# Patient Record
Sex: Female | Born: 1990 | Race: Black or African American | Hispanic: No | Marital: Single | Smoking: Never smoker
Health system: Southern US, Community
[De-identification: ages and names within clinical notes are randomized; demographics above are authoritative.]

---

## 2015-12-25 ENCOUNTER — Emergency Department (HOSPITAL_COMMUNITY): Payer: Medicaid Other

## 2015-12-25 ENCOUNTER — Encounter (HOSPITAL_COMMUNITY): Payer: Self-pay | Admitting: *Deleted

## 2015-12-25 ENCOUNTER — Emergency Department (HOSPITAL_COMMUNITY)
Admission: EM | Admit: 2015-12-25 | Discharge: 2015-12-25 | Disposition: A | Discharge status: Home / Self Care | Payer: Medicaid Other | Attending: Emergency Medicine | Admitting: Emergency Medicine

## 2015-12-25 DIAGNOSIS — X509XXA Other and unspecified overexertion or strenuous movements or postures, initial encounter: Secondary | ICD-10-CM | POA: Diagnosis not present

## 2015-12-25 DIAGNOSIS — Y929 Unspecified place or not applicable: Secondary | ICD-10-CM | POA: Insufficient documentation

## 2015-12-25 DIAGNOSIS — Y999 Unspecified external cause status: Secondary | ICD-10-CM | POA: Insufficient documentation

## 2015-12-25 DIAGNOSIS — Y9301 Activity, walking, marching and hiking: Secondary | ICD-10-CM | POA: Diagnosis not present

## 2015-12-25 DIAGNOSIS — S93401A Sprain of unspecified ligament of right ankle, initial encounter: Secondary | ICD-10-CM | POA: Diagnosis not present

## 2015-12-25 DIAGNOSIS — S99911A Unspecified injury of right ankle, initial encounter: Secondary | ICD-10-CM | POA: Diagnosis present

## 2015-12-25 MED ORDER — NAPROXEN 500 MG PO TABS: 500.0000 mg | ORAL_TABLET | Freq: Two times a day (BID) | ORAL | 0 refills | Status: AC

## 2015-12-25 MED ORDER — OXYCODONE-ACETAMINOPHEN 5-325 MG PO TABS
1.0000 | ORAL_TABLET | Freq: Once | ORAL | Status: AC
  Administered 2015-12-25: 1 via ORAL
  Filled 2015-12-25: qty 1

## 2015-12-25 MED ORDER — ONDANSETRON 4 MG PO TBDP
4.0000 mg | ORAL_TABLET | Freq: Once | ORAL | Status: AC
  Administered 2015-12-25: 4 mg via ORAL
  Filled 2015-12-25: qty 1

## 2015-12-25 MED ORDER — HYDROCODONE-ACETAMINOPHEN 5-325 MG PO TABS: 1.0000 | ORAL_TABLET | ORAL | 0 refills | Status: AC | PRN

## 2015-12-25 NOTE — ED Notes (Signed)
Bed: WTR6 Expected date:  Expected time:  Means of arrival:  Comments: 

## 2015-12-25 NOTE — ED Provider Notes (Signed)
[facility]-EMERGENCY DEPT Provider Note   CSN: [id number] Arrival date & time: 12/25/15  1254  By signing my name below, I, Emmanuella Mensah, attest that this documentation has been prepared under the direction and in the presence of Eduin Friedel, PA-C. Electronically Signed: Angelene Giovanni, ED Scribe. 12/25/15. 2:42 PM.   History   Chief Complaint Chief Complaint  Patient presents with  . Ankle Pain    HPI Comments: Barbara Ramirez is a 25 y.o. female who presents to the Emergency Department complaining of gradually worsening 10/10 right ankle pain with swelling to the lateral aspect s/p injury that occurred yesterday. She reports associated difficulty ambulating and ROM secondary to pain. She explains that she was ambulating with tennis shoes when she rolled her ankle inward. No fall or LOC sustained. No alleviating factors noted. Pt has not tried any medications PTA. She has an allergy to Penicillins. She denies any fever, chills, numbness/tingling, or any open wounds.   The history is provided by the patient. No language interpreter was used.    History reviewed. No pertinent past medical history.  There are no active problems to display for this patient.   History reviewed. No pertinent surgical history.  OB History    No data available       Home Medications    Prior to Admission medications   Not on File    Family History No family history on file.  Social History Social History  Substance Use Topics  . Smoking status: Never Smoker  . Smokeless tobacco: Never Used  . Alcohol use Yes     Allergies   Penicillins   Review of Systems Review of Systems  Constitutional: Negative for chills and fever.  Musculoskeletal: Positive for arthralgias and joint swelling.  Skin: Negative for wound.  All other systems reviewed and are negative.    Physical Exam Updated Vital Signs BP 109/63 (BP Location: Left Arm)   Pulse 108   Temp 98 F (36.7 C) (Oral)   Resp  18   LMP 12/18/2015   SpO2 96%   Physical Exam  Constitutional: She is oriented to person, place, and time. She appears well-developed and well-nourished. No distress.  HENT:  Head: Normocephalic and atraumatic.  Eyes: Conjunctivae and EOM are normal.  Neck: Neck supple. No tracheal deviation present.  Cardiovascular: Normal rate.   Pulmonary/Chest: Effort normal. No respiratory distress.  Musculoskeletal:  Right lateral ankle edematous and tender. Limited ROM of ankle. No foot or toe tenderness or deformity. 2+ DP. Brisk cap refill x 5.   Neurological: She is alert and oriented to person, place, and time.  Skin: Skin is warm and dry.  Psychiatric: She has a normal mood and affect. Her behavior is normal.  Nursing note and vitals reviewed.    ED Treatments / Results  DIAGNOSTIC STUDIES: Oxygen Saturation is 96% on RA, adequate by my interpretation.    COORDINATION OF CARE: 2:41 PM- Pt advised of plan for treatment and pt agrees. Pt informed of her x-ray results. She will receive percocet. Will provide resources for Ortho follow up.    Labs (all labs ordered are listed, but only abnormal results are displayed) Labs Reviewed - No data to display  EKG  EKG Interpretation None       Radiology Dg Ankle Complete Right  Result Date: 12/25/2015 CLINICAL DATA:  Right ankle pain and swelling since injury yesterday. EXAM: RIGHT ANKLE - COMPLETE 3+ VIEW COMPARISON:  None. FINDINGS: There is soft tissue swelling overlying the  lateral malleolus. Underlying osseous structures are intact and normally aligned. No fracture line or displaced fracture fragment seen. Ankle mortise is symmetric. IMPRESSION: Soft tissue swelling.  No osseous fracture or dislocation. Electronically Signed   By: Bary RichardStan  Maynard M.D.   On: 12/25/2015 13:43    Procedures Procedures (including critical care time)  Medications Ordered in ED Medications - No data to display   Initial Impression / Assessment and  Plan / ED Course  Barbara PennerSerena Adhira Jamil, PA-C has reviewed the triage vital signs and the nursing notes.  Pertinent labs & imaging results that were available during my care of the patient were reviewed by me and considered in my medical decision making (see chart for details).  Clinical Course   Xray with soft tissue swelling but no fracture or dislocation. Likely sprain. Will place in brace with crutches and instructed f/u with ortho. Pain meds rx given.   Prior to discharge pt had one episode of emesis and states she has been nauseated all day. 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 States she was intoxicated when she hurt her ankle. zofran ODT given. Has had no further episodes of emesis.   Final Clinical Impressions(s) / ED Diagnoses   Final diagnoses:  Right ankle sprain, initial encounter    New Prescriptions New Prescriptions   HYDROCODONE-ACETAMINOPHEN (NORCO/VICODIN) 5-325 MG TABLET    Take 1-2 tablets by mouth every 4 (four) hours as needed for severe pain.   NAPROXEN (NAPROSYN) 500 MG TABLET    Take 1 tablet (500 mg total) by mouth 2 (two) times daily.   I personally performed the services described in this documentation, which was scribed in my presence. The recorded information has been reviewed and is accurate.    Barbara CoriaSerena Y Kayl Stogdill, PA-C 12/25/15 1548    Azalia BilisKevin Campos, MD 12/26/15 [phone]

## 2015-12-25 NOTE — Discharge Instructions (Signed)
Take medication as prescribed. Ice your ankle on and off for the next 48 hrs. Follow up with Dr. Magnus IvanBlackman of orthopedics. Return to the ER for new or worsening symptoms.

## 2015-12-25 NOTE — ED Triage Notes (Signed)
Pt complains of right ankle pain and swelling since rolling her ankle yesterday while walking.

## 2018-05-27 IMAGING — CR DG ANKLE COMPLETE 3+V*R*
3 series · 3 of 3 positions shown · non-contrast
Comparison: None.

CLINICAL DATA: Right ankle pain and swelling since injury
yesterday.

EXAM:
RIGHT ANKLE - COMPLETE 3+ VIEW

[x ankle ap right]
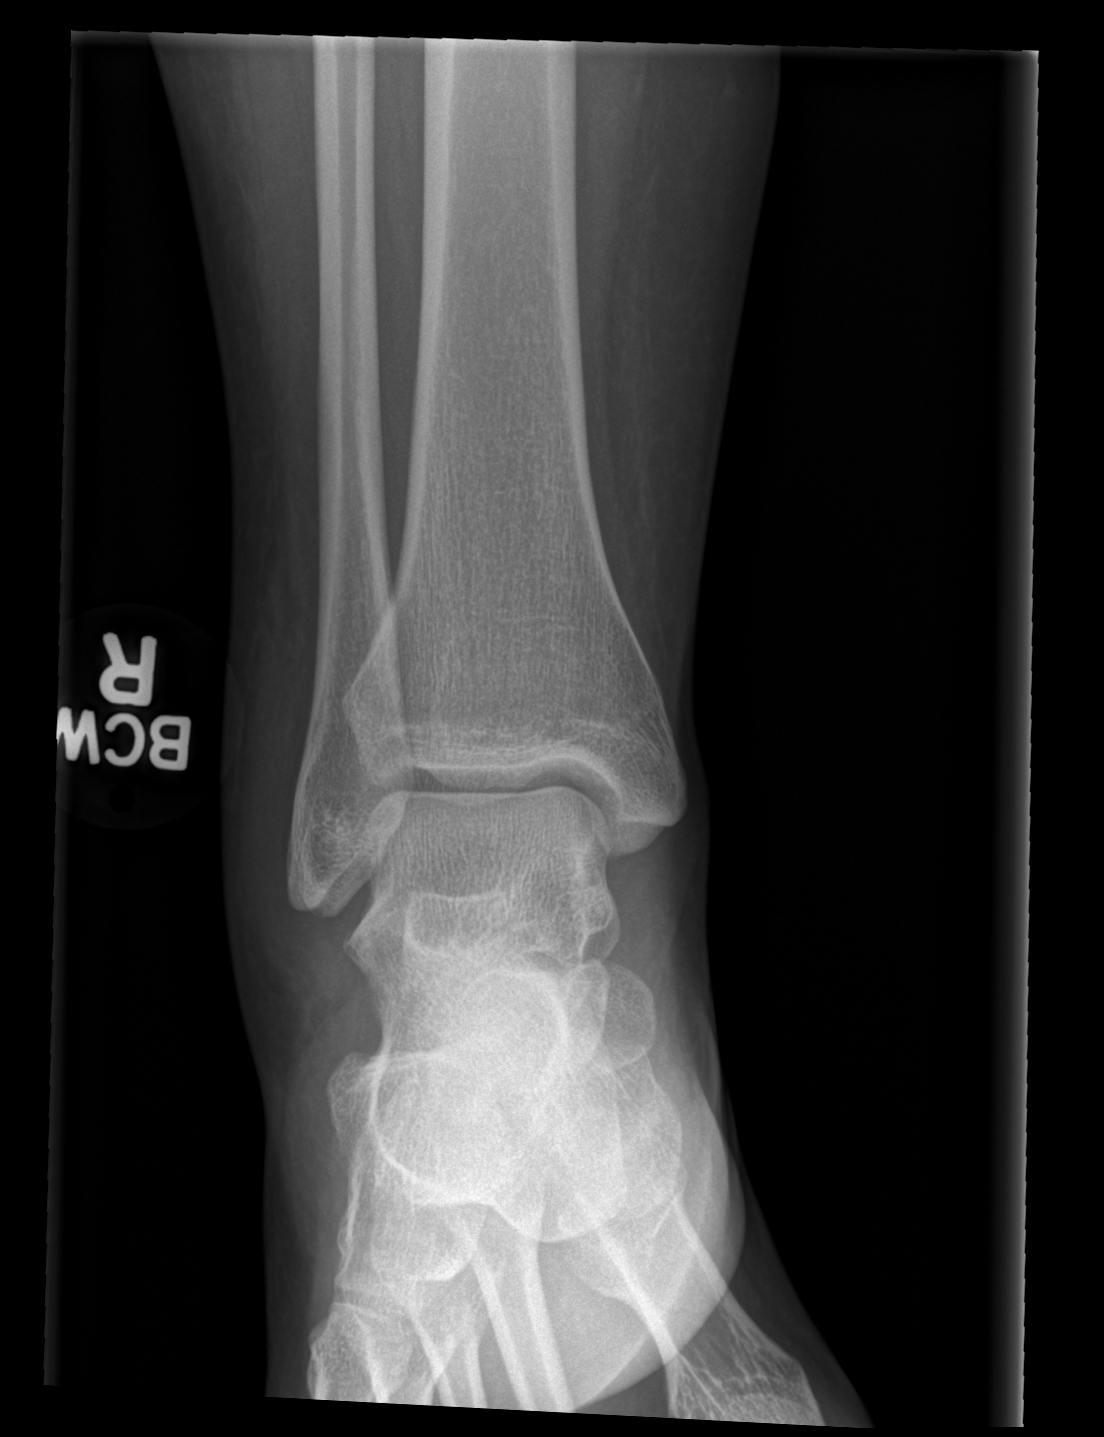

[x ankle obl right]
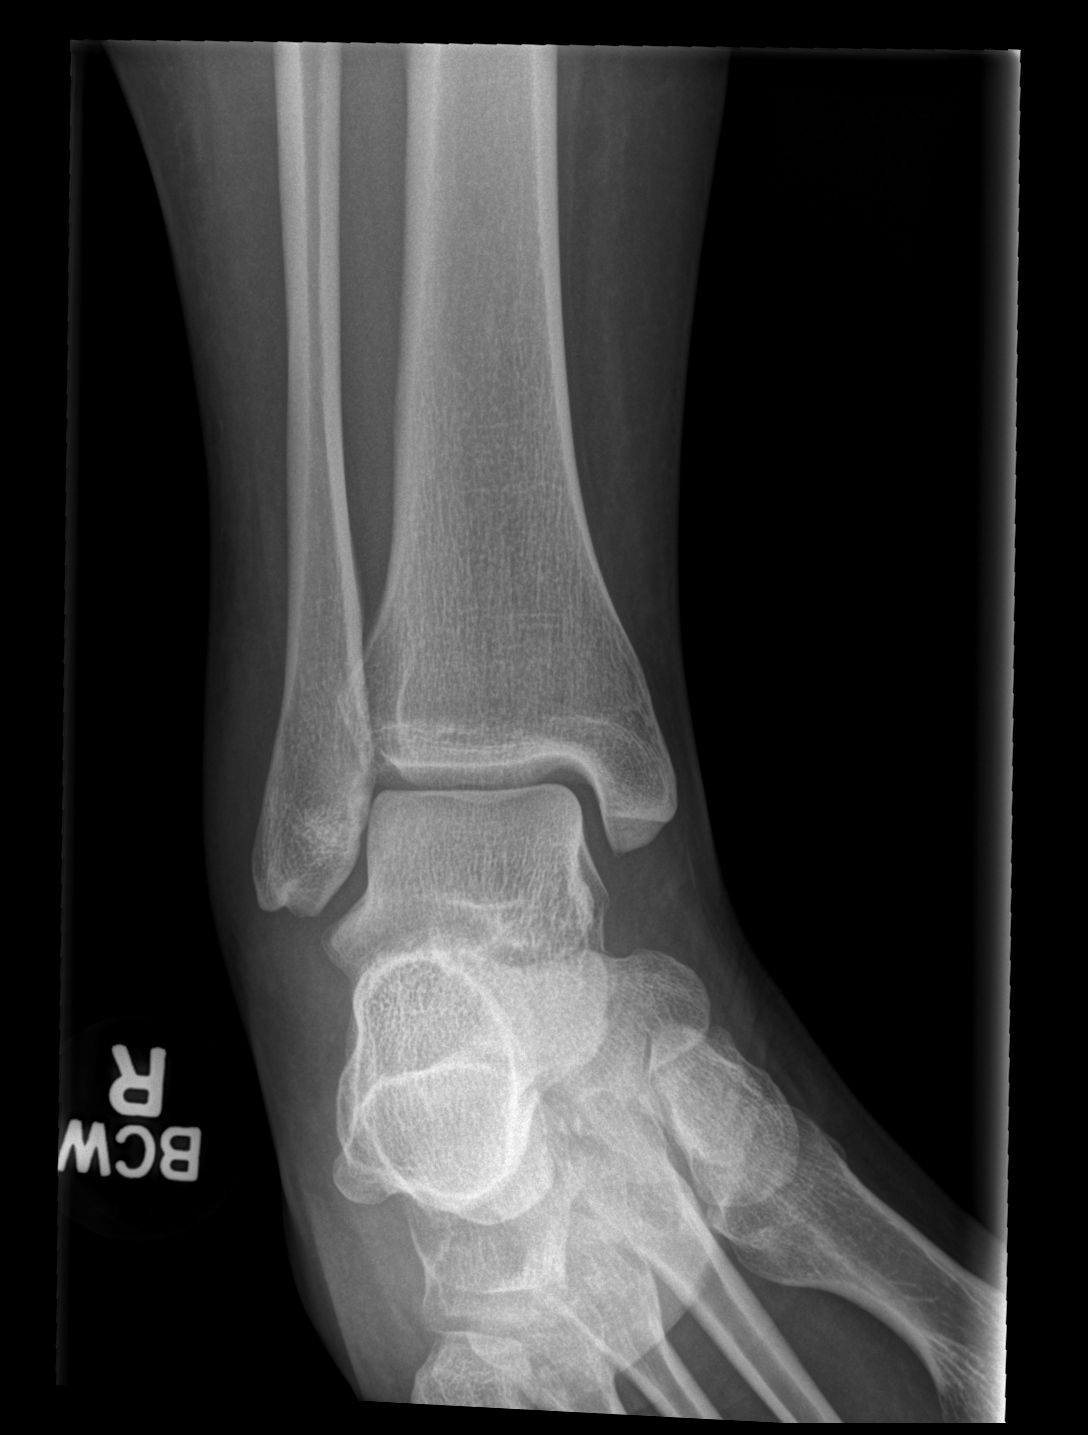

[x ankle lat right]
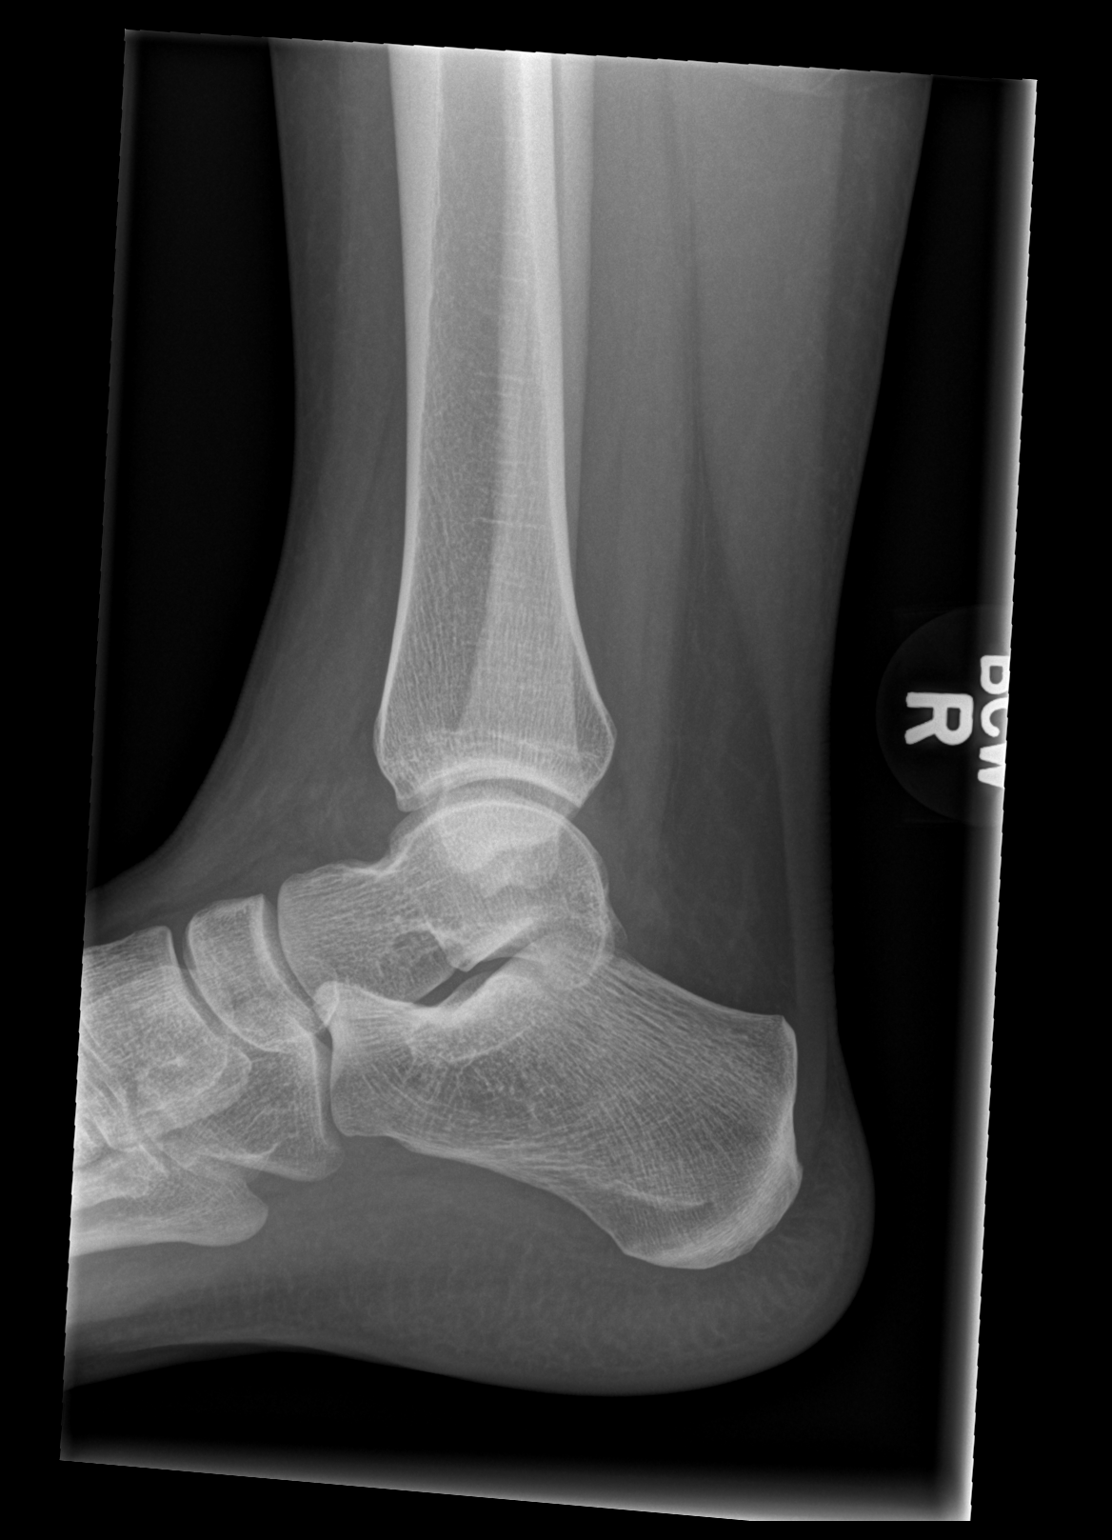

[3 of 3 positions shown; findings below may reference images not displayed]

FINDINGS: There is soft tissue swelling overlying the lateral malleolus.
Underlying osseous structures are intact and normally aligned. No
fracture line or displaced fracture fragment seen. Ankle mortise is
symmetric.
IMPRESSION: Soft tissue swelling.  No osseous fracture or dislocation.

## 2022-09-25 ENCOUNTER — Emergency Department (HOSPITAL_COMMUNITY): Payer: Self-pay

## 2022-09-25 ENCOUNTER — Other Ambulatory Visit: Payer: Self-pay

## 2022-09-25 ENCOUNTER — Emergency Department (HOSPITAL_COMMUNITY)
Admission: EM | Admit: 2022-09-25 | Discharge: 2022-09-25 | Disposition: A | Discharge status: Home / Self Care | Payer: Self-pay | Attending: Emergency Medicine | Admitting: Emergency Medicine

## 2022-09-25 ENCOUNTER — Encounter (HOSPITAL_COMMUNITY): Payer: Self-pay

## 2022-09-25 DIAGNOSIS — S0083XA Contusion of other part of head, initial encounter: Secondary | ICD-10-CM | POA: Insufficient documentation

## 2022-09-25 DIAGNOSIS — Y9259 Other trade areas as the place of occurrence of the external cause: Secondary | ICD-10-CM | POA: Insufficient documentation

## 2022-09-25 DIAGNOSIS — S0011XA Contusion of right eyelid and periocular area, initial encounter: Secondary | ICD-10-CM | POA: Insufficient documentation

## 2022-09-25 DIAGNOSIS — M25511 Pain in right shoulder: Secondary | ICD-10-CM | POA: Insufficient documentation

## 2022-09-25 MED ORDER — OXYCODONE-ACETAMINOPHEN 5-325 MG PO TABS
1.0000 | ORAL_TABLET | Freq: Once | ORAL | Status: AC
  Administered 2022-09-25: 1 via ORAL
  Filled 2022-09-25: qty 1

## 2022-09-25 MED ORDER — OXYCODONE-ACETAMINOPHEN 5-325 MG PO TABS: 1.0000 | ORAL_TABLET | Freq: Four times a day (QID) | ORAL | 0 refills | Status: AC | PRN

## 2022-09-25 NOTE — Discharge Instructions (Signed)
You were seen in the emergency department for facial pain and right shoulder pain.  Your imaging was negative for any acute fractures, dislocations, brain bleeds.  You have sustained significant bruising to the right forehead and around the right eye.  You continue to take ibuprofen, Tylenol as needed at home for pain.  For more severe pain, a short prescription for Percocet was sent to your pharmacy.  Please be advised that this can cause some level of sedation so avoid driving after taking this medication.  If you feel your symptoms are worsening, please return to the emergency department.

## 2022-09-25 NOTE — ED Notes (Signed)
PT DC by Deliah Boston
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
